# Patient Record
Sex: Male | Born: 1965 | Race: White | Hispanic: No | Marital: Married | Smoking: Never smoker
Health system: Southern US, Community
[De-identification: ages and names within clinical notes are randomized; demographics above are authoritative.]

## PROBLEM LIST (undated history)

## (undated) DIAGNOSIS — K649 Unspecified hemorrhoids: Secondary | ICD-10-CM

## (undated) DIAGNOSIS — R112 Nausea with vomiting, unspecified: Secondary | ICD-10-CM

## (undated) DIAGNOSIS — Z9889 Other specified postprocedural states: Secondary | ICD-10-CM

## (undated) DIAGNOSIS — E785 Hyperlipidemia, unspecified: Secondary | ICD-10-CM

## (undated) HISTORY — DX: Other specified postprocedural states: Z98.890

## (undated) HISTORY — DX: Unspecified hemorrhoids: K64.9

## (undated) HISTORY — DX: Nausea with vomiting, unspecified: R11.2

## (undated) HISTORY — PX: INGUINAL HERNIA REPAIR: SUR1180

## (undated) HISTORY — PX: WISDOM TOOTH EXTRACTION: SHX21

## (undated) HISTORY — DX: Hyperlipidemia, unspecified: E78.5

---

## 1998-07-26 ENCOUNTER — Ambulatory Visit (HOSPITAL_COMMUNITY): Admission: RE | Admit: 1998-07-26 | Discharge: 1998-07-26 | Payer: Self-pay | Admitting: Neurosurgery

## 1998-07-26 ENCOUNTER — Encounter: Payer: Self-pay | Admitting: Neurosurgery

## 2000-12-10 ENCOUNTER — Ambulatory Visit (HOSPITAL_COMMUNITY): Admission: RE | Admit: 2000-12-10 | Discharge: 2000-12-10 | Payer: Self-pay | Admitting: Internal Medicine

## 2006-12-24 ENCOUNTER — Encounter: Admission: RE | Admit: 2006-12-24 | Discharge: 2006-12-24 | Payer: Self-pay | Admitting: Internal Medicine

## 2007-09-22 ENCOUNTER — Encounter: Admission: RE | Admit: 2007-09-22 | Discharge: 2007-09-22 | Payer: Self-pay | Admitting: Internal Medicine

## 2008-02-20 IMAGING — US US ABDOMEN COMPLETE
1 series · 14 of 25 positions shown · non-contrast
Comparison: 12/24/2006

ABDOMEN ULTRASOUND:

CLINICAL DATA: History of gallbladder polyp. Elevated LFTs.
TECHNIQUE: Complete abdominal ultrasound examination was performed including
evaluation of the liver, gallbladder, bile ducts, pancreas, kidneys, spleen,
IVC, and abdominal aorta.

[Series 1: us abdomen complete · 0.20mm/px · 14 of 85 slices shown]
[im 1/85]
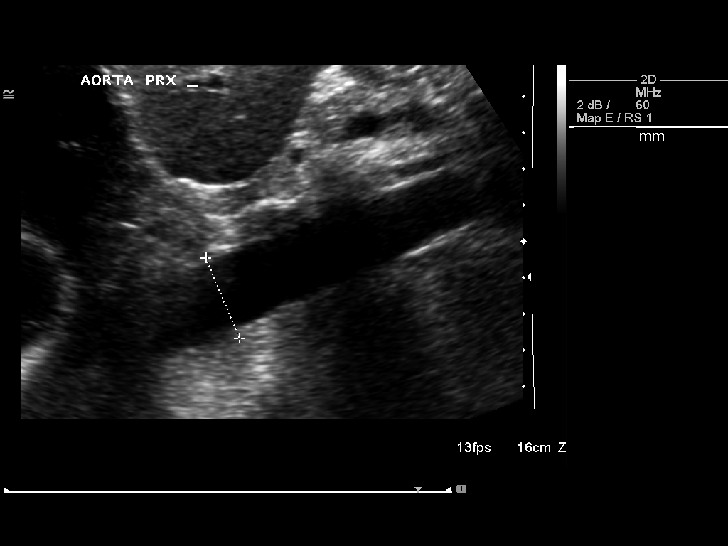
[im 8/85]
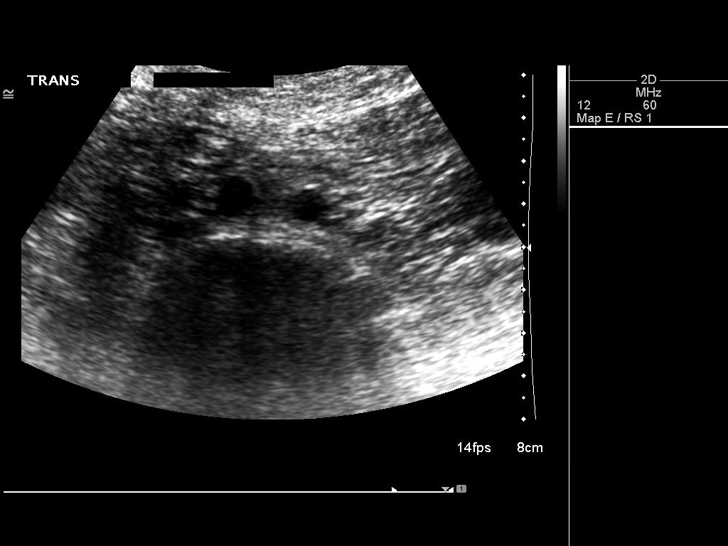
[im 15/85]
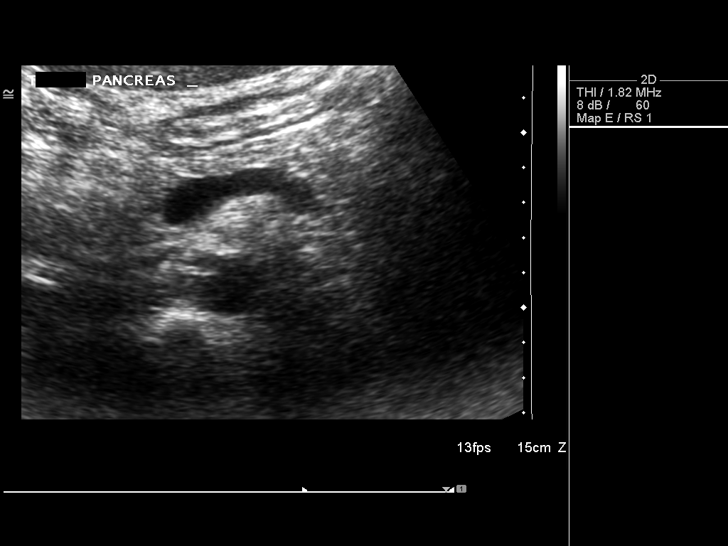
[im 22/85]
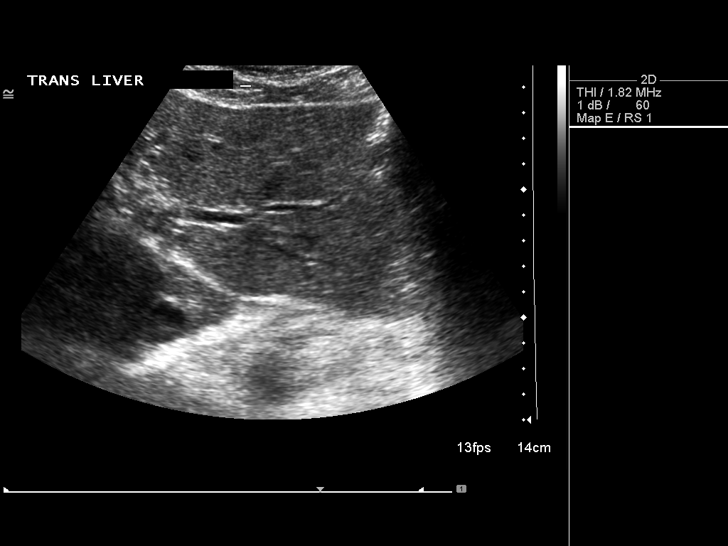
[im 29/85]
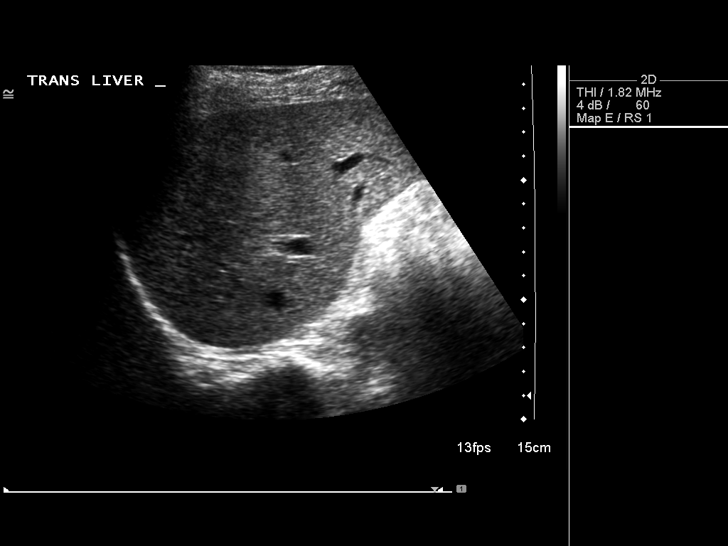
[im 32/85]
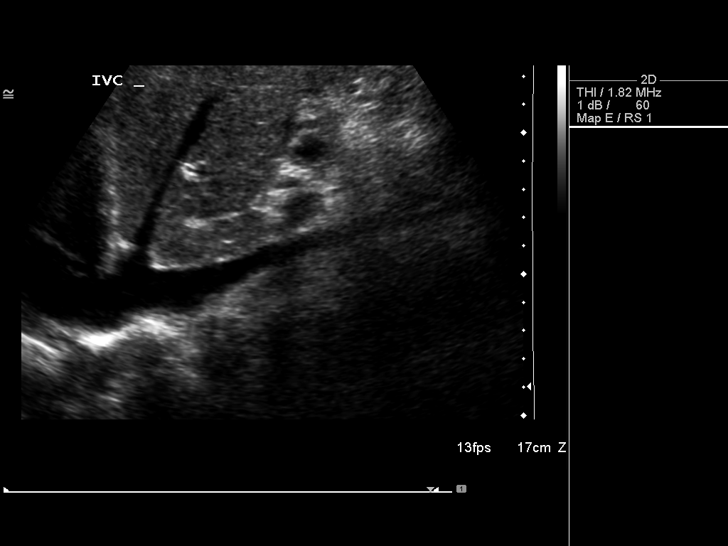
[im 39/85]
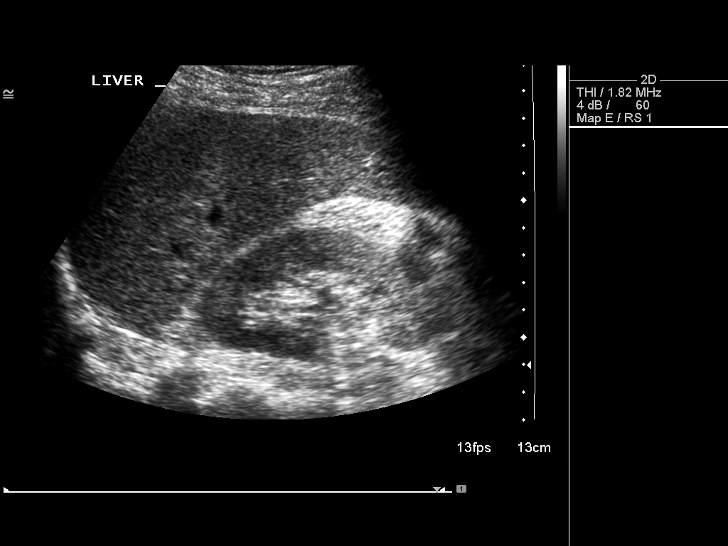
[im 46/85]
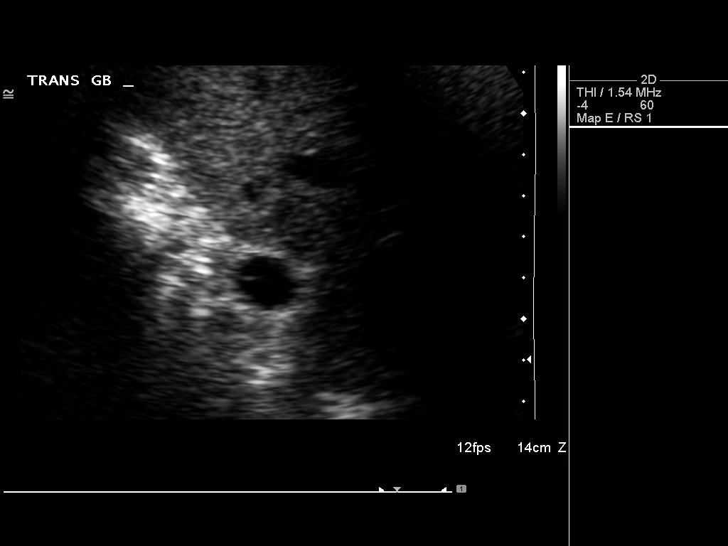
[im 53/85]
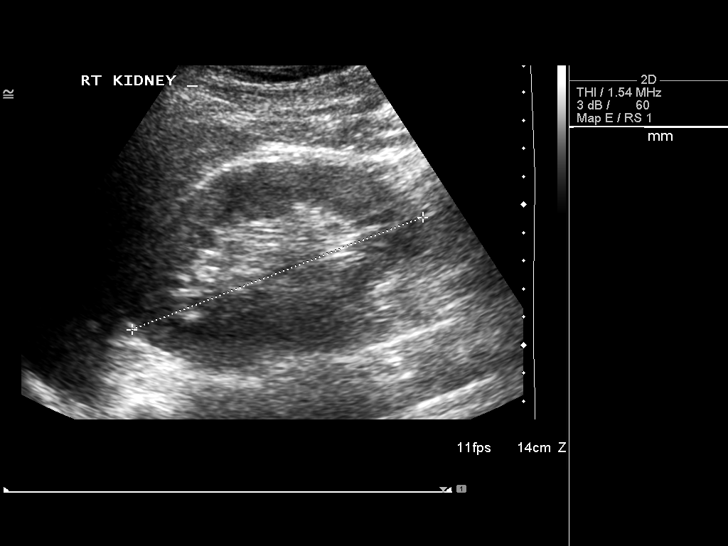
[im 57/85]
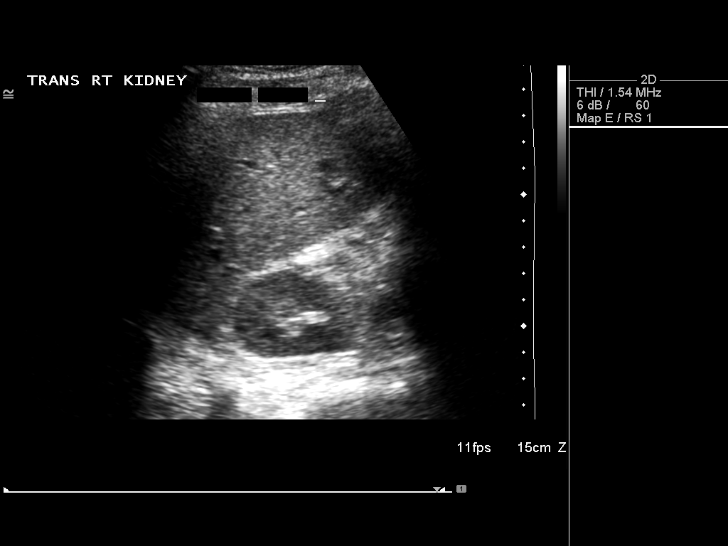
[im 64/85]
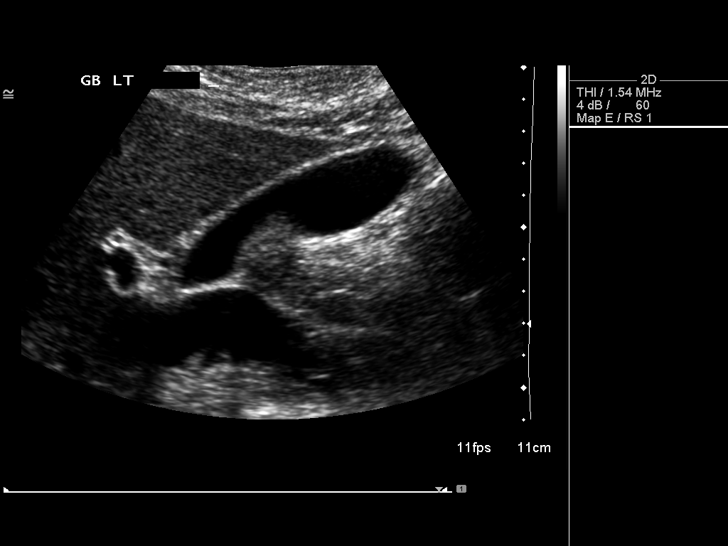
[im 71/85]
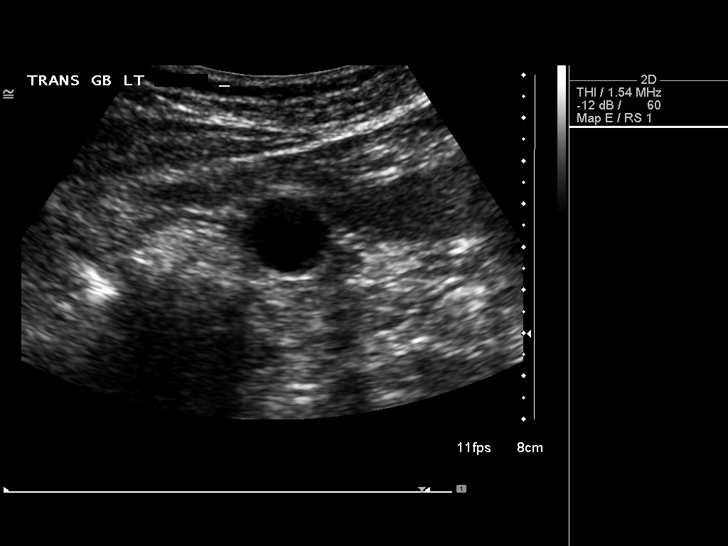
[im 78/85]
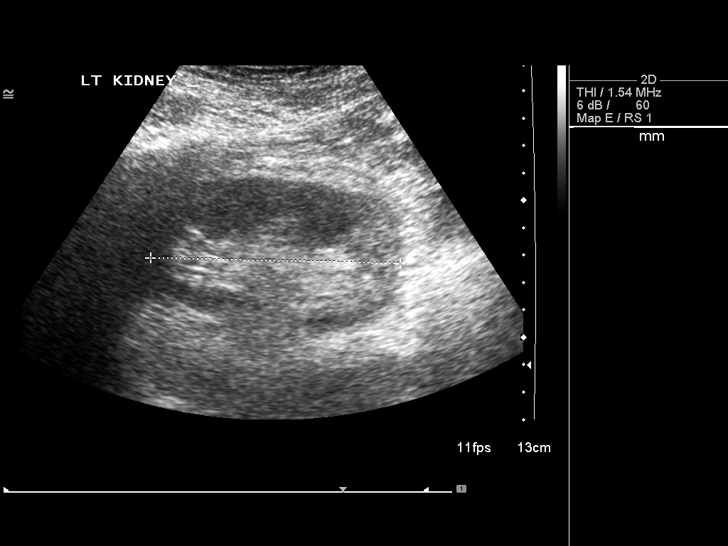
[im 85/85]
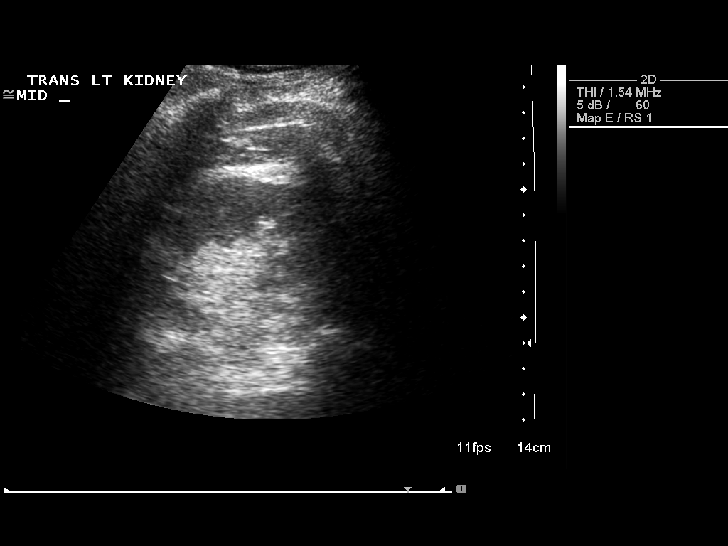

[14 of 25 positions shown; findings below may reference images not displayed]

FINDINGS: Gallbladder: No gallstones or gallbladder wall thickening. The tiny polyp
identified on the previous study has imaging features consistent with a mucosal
fold in the neck of the gallbladder on today's study. The appearance on the
previous study was more polypoid in nature, but on today's study, this clearly
represents a mucosal fold.

Common Bile Duct:  Nondilated

Liver:  Normal

Inferior Vena Cava:  Normal

Pancreas:  Normal

Spleen:  Normal

Right Kidney:  11.1 cm in long axis.  Normal

Left Kidney:  10.8 cm in long axis.  Normal

Aorta:  No aneurysm
IMPRESSION: Normal abdominal ultrasound.

## 2016-01-27 ENCOUNTER — Encounter: Payer: Self-pay | Admitting: Internal Medicine

## 2016-06-18 ENCOUNTER — Encounter: Payer: Self-pay | Admitting: Gastroenterology

## 2016-07-10 ENCOUNTER — Ambulatory Visit (AMBULATORY_SURGERY_CENTER): Payer: Self-pay

## 2016-07-10 VITALS — Ht 69.0 in | Wt 165.4 lb

## 2016-07-10 DIAGNOSIS — Z1211 Encounter for screening for malignant neoplasm of colon: Secondary | ICD-10-CM

## 2016-07-10 MED ORDER — NA SULFATE-K SULFATE-MG SULF 17.5-3.13-1.6 GM/177ML PO SOLN: ORAL | 0 refills | Status: DC

## 2016-07-10 NOTE — Progress Notes (Signed)
Pt states he had a colonoscopy done as a teenager, but it was normal!  Per pt, no allergies to soy or egg products.Pt not taking any weight loss meds or using  O2 at home.

## 2016-07-21 ENCOUNTER — Encounter: Payer: Self-pay | Admitting: Gastroenterology

## 2016-07-30 ENCOUNTER — Telehealth: Payer: Self-pay | Admitting: Gastroenterology

## 2016-08-02 NOTE — Telephone Encounter (Signed)
No charge, thanks 

## 2016-08-03 ENCOUNTER — Encounter: Payer: Self-pay | Admitting: Gastroenterology

## 2016-09-17 ENCOUNTER — Telehealth: Payer: Self-pay | Admitting: Gastroenterology

## 2016-09-17 DIAGNOSIS — Z1211 Encounter for screening for malignant neoplasm of colon: Secondary | ICD-10-CM

## 2016-09-17 MED ORDER — NA SULFATE-K SULFATE-MG SULF 17.5-3.13-1.6 GM/177ML PO SOLN: ORAL | 0 refills | Status: DC

## 2016-09-17 NOTE — Telephone Encounter (Signed)
Resent prep to pt's pharmacy and [facility] to call back if he has any further questions

## 2016-09-21 ENCOUNTER — Telehealth: Payer: Self-pay | Admitting: Gastroenterology

## 2016-09-21 ENCOUNTER — Encounter: Payer: 59 | Admitting: Gastroenterology

## 2016-09-21 NOTE — Telephone Encounter (Signed)
LMOM for pt to come and pick up instructions and to call if he has any questions

## 2016-09-21 NOTE — Telephone Encounter (Signed)
No charge. 

## 2016-09-24 ENCOUNTER — Telehealth: Payer: Self-pay | Admitting: *Deleted

## 2016-09-24 ENCOUNTER — Encounter: Payer: Self-pay | Admitting: Internal Medicine

## 2016-09-24 NOTE — Telephone Encounter (Signed)
Pt didn't come in to pick up instructions.  I called him to review instructions- he did eat eggs and a bagel this a.m. So I told him to push his fluids the rest of today.  Reviewed all instructions and pt voiced understanding.

## 2016-09-25 ENCOUNTER — Ambulatory Visit (AMBULATORY_SURGERY_CENTER): Payer: 59 | Admitting: Gastroenterology

## 2016-09-25 ENCOUNTER — Encounter: Payer: Self-pay | Admitting: Gastroenterology

## 2016-09-25 VITALS — BP 116/66 | HR 65 | Temp 98.7°F | Resp 15 | Ht 69.0 in | Wt 165.0 lb

## 2016-09-25 DIAGNOSIS — Z1212 Encounter for screening for malignant neoplasm of rectum: Secondary | ICD-10-CM

## 2016-09-25 DIAGNOSIS — Z1211 Encounter for screening for malignant neoplasm of colon: Secondary | ICD-10-CM

## 2016-09-25 DIAGNOSIS — D120 Benign neoplasm of cecum: Secondary | ICD-10-CM

## 2016-09-25 MED ORDER — SODIUM CHLORIDE 0.9 % IV SOLN: 500.0000 mL | INTRAVENOUS | Status: AC

## 2016-09-25 NOTE — Progress Notes (Signed)
A/ox3 pleased with MAC, report to Penny RN 

## 2016-09-25 NOTE — Progress Notes (Signed)
Called to room to assist during endoscopic procedure.  Patient ID and intended procedure confirmed with present staff. Received instructions for my participation in the procedure from the performing physician.  

## 2016-09-25 NOTE — Op Note (Signed)
[facility] Patient Name: Malik Johnson Procedure Date: 09/25/2016 8:05 AM MRN: [id number] Endoscopist: Milus Banister , MD Age: 50 Referring MD:  Date of Birth: 1966/07/18 Gender: Male Account #: [id number] Procedure:                Colonoscopy Indications:              Screening for colorectal malignant neoplasm Medicines:                Monitored Anesthesia Care Procedure:                Pre-Anesthesia Assessment:                           - Prior to the procedure, a History and Physical                            was performed, and patient medications and                            allergies were reviewed. The patient's tolerance of                            previous anesthesia was also reviewed. The risks                            and benefits of the procedure and the sedation                            options and risks were discussed with the patient.                            All questions were answered, and informed consent                            was obtained. Prior Anticoagulants: The patient has                            taken no previous anticoagulant or antiplatelet                            agents. ASA Grade Assessment: II - A patient with                            mild systemic disease. After reviewing the risks                            and benefits, the patient was deemed in                            satisfactory condition to undergo the procedure.                           After obtaining informed consent, the colonoscope  was passed under direct vision. Throughout the                            procedure, the patient's blood pressure, pulse, and                            oxygen saturations were monitored continuously. The                            Model CF-HQ190L [id number]) scope was introduced                            through the anus and advanced to the the cecum,                            identified by  appendiceal orifice and ileocecal                            valve. The colonoscopy was performed without                            difficulty. The patient tolerated the procedure                            well. The quality of the bowel preparation was                            good. The ileocecal valve, appendiceal orifice, and                            rectum were photographed. Scope In: 8:13:20 AM Scope Out: 8:24:11 AM Scope Withdrawal Time: 0 hours 8 minutes 37 seconds  Total Procedure Duration: 0 hours 10 minutes 51 seconds  Findings:                 A 5 mm polyp was found in the cecum. The polyp was                            sessile. The polyp was removed with a cold snare.                            Resection and retrieval were complete.                           The exam was otherwise without abnormality on                            direct and retroflexion views. Complications:            No immediate complications. Estimated blood loss:                            None. Estimated Blood Loss:     Estimated blood loss: none. Impression:               -  One 5 mm polyp in the cecum, removed with a cold                            snare. Resected and retrieved.                           - The examination was otherwise normal on direct                            and retroflexion views. Recommendation:           - Patient has a contact number available for                            emergencies. The signs and symptoms of potential                            delayed complications were discussed with the                            patient. Return to normal activities tomorrow.                            Written discharge instructions were provided to the                            patient.                           - Resume previous diet.                           - Continue present medications.                           You will receive a letter within 2-3 weeks with the                             pathology results and my final recommendations.                           If the polyp(s) is proven to be 'pre-cancerous' on                            pathology, you will need repeat colonoscopy in 5                            years. If the polyp(s) is NOT 'precancerous' on                            pathology then you should repeat colon cancer                            screening in 10 years with colonoscopy without need  for colon cancer screening by any method prior to                            then (including stool testing). Milus Banister, MD 09/25/2016 8:28:36 AM This report has been signed electronically.

## 2016-09-25 NOTE — Patient Instructions (Addendum)
YOU HAD AN ENDOSCOPIC PROCEDURE TODAY AT THE [facility]:   Refer to the procedure report that was given to you for any specific questions about what was found during the examination.  If the procedure report does not answer your questions, please call your gastroenterologist to clarify.  If you requested that your care partner not be given the details of your procedure findings, then the procedure report has been included in a sealed envelope for you to review at your convenience later.  YOU SHOULD EXPECT: Some feelings of bloating in the abdomen. Passage of more gas than usual.  Walking can help get rid of the air that was put into your GI tract during the procedure and reduce the bloating. If you had a lower endoscopy (such as a colonoscopy or flexible sigmoidoscopy) you may notice spotting of blood in your stool or on the toilet paper. If you underwent a bowel prep for your procedure, you may not have a normal bowel movement for a few days.  Please Note:  You might notice some irritation and congestion in your nose or some drainage.  This is from the oxygen used during your procedure.  There is no need for concern and it should clear up in a day or so.  SYMPTOMS TO REPORT IMMEDIATELY:   Following lower endoscopy (colonoscopy or flexible sigmoidoscopy):  Excessive amounts of blood in the stool  Significant tenderness or worsening of abdominal pains  Swelling of the abdomen that is new, acute  Fever of 100F or higher    For urgent or emergent issues, a gastroenterologist can be reached at any hour by calling [phone].   DIET:  We do recommend a small meal at first, but then you may proceed to your regular diet.  Drink plenty of fluids but you should avoid alcoholic beverages for 24 hours.  ACTIVITY:  You should plan to take it easy for the rest of today and you should NOT DRIVE or use heavy machinery until tomorrow (because of the sedation medicines used during the test).     FOLLOW UP: Our staff will call the number listed on your records the next business day following your procedure to check on you and address any questions or concerns that you may have regarding the information given to you following your procedure. If we do not reach you, we will leave a message.  However, if you are feeling well and you are not experiencing any problems, there is no need to return our call.  We will assume that you have returned to your regular daily activities without incident.  If any biopsies were taken you will be contacted by phone or by letter within the next 1-3 weeks.  Please call us at [phone] if you have not heard about the biopsies in 3 weeks.    SIGNATURES/CONFIDENTIALITY: You and/or your care partner have signed paperwork which will be entered into your electronic medical record.  These signatures attest to the fact that that the information above on your After Visit Summary has been reviewed and is understood.  Full responsibility of the confidentiality of this discharge information lies with you and/or your care-partner.    Information on polyps given to you today  Await pathology report    

## 2016-09-28 ENCOUNTER — Telehealth: Payer: Self-pay | Admitting: *Deleted

## 2016-09-28 ENCOUNTER — Telehealth: Payer: Self-pay

## 2016-09-28 NOTE — Telephone Encounter (Signed)
Message left

## 2016-09-28 NOTE — Telephone Encounter (Signed)
  Follow up Call-  Call back number 09/25/2016  Post procedure Call Back phone  # [phone]  Permission to leave phone message Yes  Some recent data might be hidden    Patient was called for follow up after his procedure on 09/25/2016. No answer at the number given for follow up phone call. A message was left on the answering machine.

## 2016-10-01 ENCOUNTER — Encounter: Payer: Self-pay | Admitting: Gastroenterology

## 2016-10-14 ENCOUNTER — Telehealth: Payer: Self-pay | Admitting: Gastroenterology

## 2016-10-14 NOTE — Telephone Encounter (Signed)
Patient found his letter and now has results.

## 2016-10-14 NOTE — Telephone Encounter (Signed)
Left message for pt to call back  °

## 2016-12-24 DIAGNOSIS — Z0000 Encounter for general adult medical examination without abnormal findings: Secondary | ICD-10-CM | POA: Diagnosis not present

## 2016-12-29 DIAGNOSIS — Z1389 Encounter for screening for other disorder: Secondary | ICD-10-CM | POA: Diagnosis not present

## 2016-12-29 DIAGNOSIS — E784 Other hyperlipidemia: Secondary | ICD-10-CM | POA: Diagnosis not present

## 2016-12-29 DIAGNOSIS — N320 Bladder-neck obstruction: Secondary | ICD-10-CM | POA: Diagnosis not present

## 2016-12-29 DIAGNOSIS — Z0000 Encounter for general adult medical examination without abnormal findings: Secondary | ICD-10-CM | POA: Diagnosis not present

## 2017-01-01 DIAGNOSIS — Z1212 Encounter for screening for malignant neoplasm of rectum: Secondary | ICD-10-CM | POA: Diagnosis not present

## 2017-08-23 DIAGNOSIS — K649 Unspecified hemorrhoids: Secondary | ICD-10-CM | POA: Diagnosis not present

## 2017-08-23 DIAGNOSIS — N411 Chronic prostatitis: Secondary | ICD-10-CM | POA: Diagnosis not present

## 2017-09-16 DIAGNOSIS — R05 Cough: Secondary | ICD-10-CM | POA: Diagnosis not present

## 2017-09-16 DIAGNOSIS — J069 Acute upper respiratory infection, unspecified: Secondary | ICD-10-CM | POA: Diagnosis not present

## 2017-12-09 DIAGNOSIS — R102 Pelvic and perineal pain: Secondary | ICD-10-CM | POA: Diagnosis not present

## 2017-12-16 DIAGNOSIS — M545 Low back pain: Secondary | ICD-10-CM | POA: Diagnosis not present

## 2017-12-21 DIAGNOSIS — M545 Low back pain: Secondary | ICD-10-CM | POA: Diagnosis not present

## 2017-12-22 DIAGNOSIS — Z0000 Encounter for general adult medical examination without abnormal findings: Secondary | ICD-10-CM | POA: Diagnosis not present

## 2018-01-04 DIAGNOSIS — Z0000 Encounter for general adult medical examination without abnormal findings: Secondary | ICD-10-CM | POA: Diagnosis not present

## 2018-01-04 DIAGNOSIS — Z1389 Encounter for screening for other disorder: Secondary | ICD-10-CM | POA: Diagnosis not present

## 2018-01-04 DIAGNOSIS — E7849 Other hyperlipidemia: Secondary | ICD-10-CM | POA: Diagnosis not present

## 2018-01-04 DIAGNOSIS — M545 Low back pain: Secondary | ICD-10-CM | POA: Diagnosis not present

## 2018-01-07 DIAGNOSIS — Z1212 Encounter for screening for malignant neoplasm of rectum: Secondary | ICD-10-CM | POA: Diagnosis not present

## 2018-11-20 DIAGNOSIS — M25521 Pain in right elbow: Secondary | ICD-10-CM | POA: Diagnosis not present

## 2018-11-24 DIAGNOSIS — M25521 Pain in right elbow: Secondary | ICD-10-CM | POA: Diagnosis not present

## 2018-12-19 DIAGNOSIS — M25521 Pain in right elbow: Secondary | ICD-10-CM | POA: Diagnosis not present

## 2019-01-05 DIAGNOSIS — Z0000 Encounter for general adult medical examination without abnormal findings: Secondary | ICD-10-CM | POA: Diagnosis not present

## 2019-01-05 DIAGNOSIS — Z125 Encounter for screening for malignant neoplasm of prostate: Secondary | ICD-10-CM | POA: Diagnosis not present

## 2019-01-05 DIAGNOSIS — E7849 Other hyperlipidemia: Secondary | ICD-10-CM | POA: Diagnosis not present

## 2019-01-12 DIAGNOSIS — E785 Hyperlipidemia, unspecified: Secondary | ICD-10-CM | POA: Diagnosis not present

## 2019-01-12 DIAGNOSIS — N529 Male erectile dysfunction, unspecified: Secondary | ICD-10-CM | POA: Diagnosis not present

## 2019-01-12 DIAGNOSIS — M545 Low back pain: Secondary | ICD-10-CM | POA: Diagnosis not present

## 2019-01-12 DIAGNOSIS — Z0000 Encounter for general adult medical examination without abnormal findings: Secondary | ICD-10-CM | POA: Diagnosis not present

## 2019-01-12 DIAGNOSIS — Z1331 Encounter for screening for depression: Secondary | ICD-10-CM | POA: Diagnosis not present

## 2020-01-11 DIAGNOSIS — E7849 Other hyperlipidemia: Secondary | ICD-10-CM | POA: Diagnosis not present

## 2020-01-11 DIAGNOSIS — Z0000 Encounter for general adult medical examination without abnormal findings: Secondary | ICD-10-CM | POA: Diagnosis not present

## 2020-01-11 DIAGNOSIS — Z125 Encounter for screening for malignant neoplasm of prostate: Secondary | ICD-10-CM | POA: Diagnosis not present

## 2020-01-18 DIAGNOSIS — E785 Hyperlipidemia, unspecified: Secondary | ICD-10-CM | POA: Diagnosis not present

## 2020-01-18 DIAGNOSIS — R030 Elevated blood-pressure reading, without diagnosis of hypertension: Secondary | ICD-10-CM | POA: Diagnosis not present

## 2020-01-18 DIAGNOSIS — M545 Low back pain: Secondary | ICD-10-CM | POA: Diagnosis not present

## 2020-01-18 DIAGNOSIS — R82998 Other abnormal findings in urine: Secondary | ICD-10-CM | POA: Diagnosis not present

## 2020-01-18 DIAGNOSIS — Z1331 Encounter for screening for depression: Secondary | ICD-10-CM | POA: Diagnosis not present

## 2020-01-18 DIAGNOSIS — N529 Male erectile dysfunction, unspecified: Secondary | ICD-10-CM | POA: Diagnosis not present

## 2020-01-18 DIAGNOSIS — Z0000 Encounter for general adult medical examination without abnormal findings: Secondary | ICD-10-CM | POA: Diagnosis not present

## 2020-04-02 DIAGNOSIS — R030 Elevated blood-pressure reading, without diagnosis of hypertension: Secondary | ICD-10-CM | POA: Diagnosis not present

## 2020-04-04 DIAGNOSIS — R030 Elevated blood-pressure reading, without diagnosis of hypertension: Secondary | ICD-10-CM | POA: Diagnosis not present

## 2020-10-11 DIAGNOSIS — G44209 Tension-type headache, unspecified, not intractable: Secondary | ICD-10-CM | POA: Diagnosis not present

## 2020-10-14 DIAGNOSIS — I10 Essential (primary) hypertension: Secondary | ICD-10-CM | POA: Diagnosis not present

## 2020-11-04 DIAGNOSIS — I10 Essential (primary) hypertension: Secondary | ICD-10-CM | POA: Diagnosis not present

## 2021-01-20 DIAGNOSIS — E785 Hyperlipidemia, unspecified: Secondary | ICD-10-CM | POA: Diagnosis not present

## 2021-01-20 DIAGNOSIS — Z125 Encounter for screening for malignant neoplasm of prostate: Secondary | ICD-10-CM | POA: Diagnosis not present

## 2021-01-20 DIAGNOSIS — I10 Essential (primary) hypertension: Secondary | ICD-10-CM | POA: Diagnosis not present

## 2021-02-04 DIAGNOSIS — R82998 Other abnormal findings in urine: Secondary | ICD-10-CM | POA: Diagnosis not present

## 2021-02-04 DIAGNOSIS — Z0000 Encounter for general adult medical examination without abnormal findings: Secondary | ICD-10-CM | POA: Diagnosis not present

## 2021-02-04 DIAGNOSIS — I10 Essential (primary) hypertension: Secondary | ICD-10-CM | POA: Diagnosis not present

## 2021-05-08 DIAGNOSIS — I10 Essential (primary) hypertension: Secondary | ICD-10-CM | POA: Diagnosis not present

## 2021-05-13 DIAGNOSIS — D2262 Melanocytic nevi of left upper limb, including shoulder: Secondary | ICD-10-CM | POA: Diagnosis not present

## 2021-05-13 DIAGNOSIS — D224 Melanocytic nevi of scalp and neck: Secondary | ICD-10-CM | POA: Diagnosis not present

## 2021-05-13 DIAGNOSIS — D2261 Melanocytic nevi of right upper limb, including shoulder: Secondary | ICD-10-CM | POA: Diagnosis not present

## 2021-05-13 DIAGNOSIS — L821 Other seborrheic keratosis: Secondary | ICD-10-CM | POA: Diagnosis not present

## 2021-06-25 DIAGNOSIS — R059 Cough, unspecified: Secondary | ICD-10-CM | POA: Diagnosis not present

## 2021-06-25 DIAGNOSIS — U071 COVID-19: Secondary | ICD-10-CM | POA: Diagnosis not present

## 2021-06-26 ENCOUNTER — Other Ambulatory Visit: Payer: Self-pay | Admitting: Internal Medicine

## 2021-06-26 DIAGNOSIS — R911 Solitary pulmonary nodule: Secondary | ICD-10-CM

## 2021-07-21 ENCOUNTER — Ambulatory Visit
Admission: RE | Admit: 2021-07-21 | Discharge: 2021-07-21 | Disposition: A | Discharge status: Home / Self Care | Payer: BC Managed Care – PPO | Source: Ambulatory Visit | Attending: Internal Medicine | Admitting: Internal Medicine

## 2021-07-21 ENCOUNTER — Other Ambulatory Visit: Payer: Self-pay

## 2021-07-21 DIAGNOSIS — R911 Solitary pulmonary nodule: Secondary | ICD-10-CM | POA: Diagnosis not present

## 2021-07-21 DIAGNOSIS — I2510 Atherosclerotic heart disease of native coronary artery without angina pectoris: Secondary | ICD-10-CM | POA: Diagnosis not present

## 2021-07-21 MED ORDER — IOPAMIDOL (ISOVUE-300) INJECTION 61%
75.0000 mL | Freq: Once | INTRAVENOUS | Status: AC | PRN
  Administered 2021-07-21: 75 mL via INTRAVENOUS

## 2021-08-31 ENCOUNTER — Encounter: Payer: Self-pay | Admitting: Gastroenterology

## 2021-09-12 ENCOUNTER — Encounter: Payer: Self-pay | Admitting: Gastroenterology

## 2021-09-22 ENCOUNTER — Encounter: Payer: Self-pay | Admitting: Gastroenterology

## 2021-09-22 ENCOUNTER — Ambulatory Visit (AMBULATORY_SURGERY_CENTER): Payer: BC Managed Care – PPO

## 2021-09-22 ENCOUNTER — Other Ambulatory Visit: Payer: Self-pay

## 2021-09-22 VITALS — Ht 68.0 in | Wt 165.0 lb

## 2021-09-22 DIAGNOSIS — Z86010 Personal history of colonic polyps: Secondary | ICD-10-CM

## 2021-09-22 MED ORDER — PLENVU 140 G PO SOLR: 1.0000 | ORAL | 0 refills | Status: DC

## 2021-09-22 NOTE — Progress Notes (Signed)
   Patient's pre-visit was done today over the phone with the patient   Name,DOB and address verified.   Patient denies any allergies to Eggs and Soy.  Patient denies any problems with anesthesia/sedation. Patient denies taking diet pills or blood thinners.  Denies atrial flutter or atrial fib Denies chronic constipation No home Oxygen.   Packet of Prep instructions mailed to patient including a copy of a consent form-pt is aware.  Patient understands to call us back with any questions or concerns.  Patient is aware of our care-partner policy and [id number] safety protocol.   EMMI education assigned to the patient for the procedure, sent to [facility].

## 2021-10-03 ENCOUNTER — Ambulatory Visit (AMBULATORY_SURGERY_CENTER): Payer: BC Managed Care – PPO | Admitting: Gastroenterology

## 2021-10-03 ENCOUNTER — Encounter: Payer: Self-pay | Admitting: Gastroenterology

## 2021-10-03 VITALS — BP 107/64 | HR 68 | Temp 98.8°F | Resp 10 | Ht 68.0 in | Wt 165.0 lb

## 2021-10-03 DIAGNOSIS — K635 Polyp of colon: Secondary | ICD-10-CM

## 2021-10-03 DIAGNOSIS — D122 Benign neoplasm of ascending colon: Secondary | ICD-10-CM

## 2021-10-03 DIAGNOSIS — Z86010 Personal history of colonic polyps: Secondary | ICD-10-CM

## 2021-10-03 DIAGNOSIS — Z1211 Encounter for screening for malignant neoplasm of colon: Secondary | ICD-10-CM | POA: Diagnosis not present

## 2021-10-03 MED ORDER — SODIUM CHLORIDE 0.9 % IV SOLN: 500.0000 mL | Freq: Once | INTRAVENOUS | Status: DC

## 2021-10-03 NOTE — Progress Notes (Signed)
VS-CW  Pt's states no medical or surgical changes since previsit or office visit.  

## 2021-10-03 NOTE — Progress Notes (Signed)
Called to room to assist during endoscopic procedure.  Patient ID and intended procedure confirmed with present staff. Received instructions for my participation in the procedure from the performing physician.  

## 2021-10-03 NOTE — Progress Notes (Signed)
Report given to PACU, vss 

## 2021-10-03 NOTE — Progress Notes (Signed)
HPI: This is a man with h/o polyps  Colonoscopy 2017 single subCM SSA  ROS: complete GI ROS as described in HPI, all other review negative.  Constitutional:  No unintentional weight loss   Past Medical History:  Diagnosis Date   Hemorrhoids    Hyperlipidemia    Post-operative nausea and vomiting     Past Surgical History:  Procedure Laterality Date   INGUINAL HERNIA REPAIR     right side   WISDOM TOOTH EXTRACTION      Current Outpatient Medications  Medication Sig Dispense Refill   rosuvastatin (CRESTOR) 20 MG tablet 1 tablet     sildenafil (REVATIO) 20 MG tablet take 2 to 5 tablets     pravastatin (PRAVACHOL) 40 MG tablet Take 40 mg by mouth. Take one pill 3 times a week (Patient not taking: Reported on 09/22/2021)     Current Facility-Administered Medications  Medication Dose Route Frequency Provider Last Rate Last Admin   0.9 %  sodium chloride infusion  500 mL Intravenous Continuous Milus Banister, MD       0.9 %  sodium chloride infusion  500 mL Intravenous Continuous Milus Banister, MD       0.9 %  sodium chloride infusion  500 mL Intravenous Once Milus Banister, MD        Allergies as of 10/03/2021   (No Known Allergies)    Family History  Problem Relation Age of Onset   Colon polyps Father    Prostate cancer Father    Breast cancer Maternal Grandmother    Heart disease Maternal Grandfather    Colon cancer Neg Hx    Stomach cancer Neg Hx    Rectal cancer Neg Hx    Pancreatic cancer Neg Hx    Esophageal cancer Neg Hx     Social History   Socioeconomic History   Marital status: Married    Spouse name: Not on file   Number of children: Not on file   Years of education: Not on file   Highest education level: Not on file  Occupational History   Not on file  Tobacco Use   Smoking status: Never   Smokeless tobacco: Never  Vaping Use   Vaping Use: Never used  Substance and Sexual Activity   Alcohol use: No   Drug use: No   Sexual activity:  Not on file  Other Topics Concern   Not on file  Social History Narrative   Not on file   Social Determinants of Health   Financial Resource Strain: Not on file  Food Insecurity: Not on file  Transportation Needs: Not on file  Physical Activity: Not on file  Stress: Not on file  Social Connections: Not on file  Intimate Partner Violence: Not on file     Physical Exam: BP (!) 130/98    Pulse 99    Temp 98.8 F (37.1 C)    Ht 5\' 8"  (1.727 m)    Wt 165 lb (74.8 kg)    SpO2 97%    BMI 25.09 kg/m  Constitutional: generally well-appearing Psychiatric: alert and oriented x3 Lungs: CTA bilaterally Heart: no MCR  Assessment and plan: 55 y.o. male with h/o polyps  Colonoscopy today  Care is appropriate for the ambulatory setting.  Owens Loffler, MD [facility] 10/03/2021, 10:47 AM

## 2021-10-03 NOTE — Patient Instructions (Signed)
Information on polys given to you today.  Await pathology results.  Resume previous diet and medications.  Repeat colonoscopy in 10 years for screening purposes.   YOU HAD AN ENDOSCOPIC PROCEDURE TODAY AT [facility]:   Refer to the procedure report that was given to you for any specific questions about what was found during the examination.  If the procedure report does not answer your questions, please call your gastroenterologist to clarify.  If you requested that your care partner not be given the details of your procedure findings, then the procedure report has been included in a sealed envelope for you to review at your convenience later.  YOU SHOULD EXPECT: Some feelings of bloating in the abdomen. Passage of more gas than usual.  Walking can help get rid of the air that was put into your GI tract during the procedure and reduce the bloating. If you had a lower endoscopy (such as a colonoscopy or flexible sigmoidoscopy) you may notice spotting of blood in your stool or on the toilet paper. If you underwent a bowel prep for your procedure, you may not have a normal bowel movement for a few days.  Please Note:  You might notice some irritation and congestion in your nose or some drainage.  This is from the oxygen used during your procedure.  There is no need for concern and it should clear up in a day or so.  SYMPTOMS TO REPORT IMMEDIATELY:  Following lower endoscopy (colonoscopy or flexible sigmoidoscopy):  Excessive amounts of blood in the stool  Significant tenderness or worsening of abdominal pains  Swelling of the abdomen that is new, acute  Fever of 100F or higher   For urgent or emergent issues, a gastroenterologist can be reached at any hour by calling [phone]. Do not use MyChart messaging for urgent concerns.    DIET:  We do recommend a small meal at first, but then you may proceed to your regular diet.  Drink plenty of fluids but you should avoid  alcoholic beverages for 24 hours.  ACTIVITY:  You should plan to take it easy for the rest of today and you should NOT DRIVE or use heavy machinery until tomorrow (because of the sedation medicines used during the test).    FOLLOW UP: Our staff will call the number listed on your records 48-72 hours following your procedure to check on you and address any questions or concerns that you may have regarding the information given to you following your procedure. If we do not reach you, we will leave a message.  We will attempt to reach you two times.  During this call, we will ask if you have developed any symptoms of COVID 19. If you develop any symptoms (ie: fever, flu-like symptoms, shortness of breath, cough etc.) before then, please call [phone].  If you test positive for Covid 19 in the 2 weeks post procedure, please call and report this information to [place].    If any biopsies were taken you will be contacted by phone or by letter within the next 1-3 weeks.  Please call us at [phone] if you have not heard about the biopsies in 3 weeks.    SIGNATURES/CONFIDENTIALITY: You and/or your care partner have signed paperwork which will be entered into your electronic medical record.  These signatures attest to the fact that that the information above on your After Visit Summary has been reviewed and is understood.  Full responsibility of the confidentiality of  this discharge information lies with you and/or your care-partner.

## 2021-10-03 NOTE — Op Note (Signed)
[facility] Patient Name: Zuriel Roskos Procedure Date: 10/03/2021 10:51 AM MRN: [id number] Endoscopist: Milus Banister , MD Age: 55 Referring MD:  Date of Birth: 06/19/1966 Gender: Male Account #: [id number] Procedure:                Colonoscopy Indications:              High risk colon cancer surveillance: Personal                            history of colonic polyps; Colonoscopy 2017 single                            subCM SSA Medicines:                Monitored Anesthesia Care Procedure:                Pre-Anesthesia Assessment:                           - Prior to the procedure, a History and Physical                            was performed, and patient medications and                            allergies were reviewed. The patient's tolerance of                            previous anesthesia was also reviewed. The risks                            and benefits of the procedure and the sedation                            options and risks were discussed with the patient.                            All questions were answered, and informed consent                            was obtained. Prior Anticoagulants: The patient has                            taken no previous anticoagulant or antiplatelet                            agents. ASA Grade Assessment: II - A patient with                            mild systemic disease. After reviewing the risks                            and benefits, the patient was deemed in  satisfactory condition to undergo the procedure.                           After obtaining informed consent, the colonoscope                            was passed under direct vision. Throughout the                            procedure, the patient's blood pressure, pulse, and                            oxygen saturations were monitored continuously. The                            Olympus CF-HQ190L ([id number]) Colonoscope was                             introduced through the anus and advanced to the the                            cecum, identified by appendiceal orifice and                            ileocecal valve. The colonoscopy was performed                            without difficulty. The patient tolerated the                            procedure well. The quality of the bowel                            preparation was good. The ileocecal valve,                            appendiceal orifice, and rectum were photographed. Scope In: 10:57:25 AM Scope Out: 11:10:29 AM Scope Withdrawal Time: 0 hours 10 minutes 21 seconds  Total Procedure Duration: 0 hours 13 minutes 4 seconds  Findings:                 A 2 mm polyp was found in the ascending colon. The                            polyp was sessile. The polyp was removed with a                            cold snare. Resection was complete, but the polyp                            tissue was not retrieved.                           The exam was otherwise without abnormality on  direct and retroflexion views. Complications:            No immediate complications. Estimated blood loss:                            None. Estimated Blood Loss:     Estimated blood loss: none. Impression:               - One 2 mm polyp in the ascending colon, removed                            with a cold snare. Complete resection. Polyp tissue                            not retrieved.                           - The examination was otherwise normal on direct                            and retroflexion views. Recommendation:           - Patient has a contact number available for                            emergencies. The signs and symptoms of potential                            delayed complications were discussed with the                            patient. Return to normal activities tomorrow.                            Written discharge instructions were  provided to the                            patient.                           - Resume previous diet.                           - Continue present medications.                           - Repeat colonoscopy in 10 years for screening. Milus Banister, MD 10/03/2021 11:14:33 AM This report has been signed electronically.

## 2021-10-08 ENCOUNTER — Telehealth: Payer: Self-pay | Admitting: *Deleted

## 2021-10-08 NOTE — Telephone Encounter (Signed)
Left message on f/u call 

## 2021-10-27 ENCOUNTER — Encounter: Payer: BC Managed Care – PPO | Admitting: Gastroenterology

## 2021-11-14 ENCOUNTER — Encounter: Payer: BC Managed Care – PPO | Admitting: Gastroenterology

## 2021-12-10 DIAGNOSIS — J069 Acute upper respiratory infection, unspecified: Secondary | ICD-10-CM | POA: Diagnosis not present

## 2021-12-24 DIAGNOSIS — J4520 Mild intermittent asthma, uncomplicated: Secondary | ICD-10-CM | POA: Diagnosis not present

## 2021-12-24 DIAGNOSIS — Z1152 Encounter for screening for COVID-19: Secondary | ICD-10-CM | POA: Diagnosis not present

## 2021-12-24 DIAGNOSIS — R051 Acute cough: Secondary | ICD-10-CM | POA: Diagnosis not present

## 2022-02-10 DIAGNOSIS — Z125 Encounter for screening for malignant neoplasm of prostate: Secondary | ICD-10-CM | POA: Diagnosis not present

## 2022-02-10 DIAGNOSIS — I10 Essential (primary) hypertension: Secondary | ICD-10-CM | POA: Diagnosis not present

## 2022-02-17 DIAGNOSIS — Z1331 Encounter for screening for depression: Secondary | ICD-10-CM | POA: Diagnosis not present

## 2022-02-17 DIAGNOSIS — Z0000 Encounter for general adult medical examination without abnormal findings: Secondary | ICD-10-CM | POA: Diagnosis not present

## 2022-02-17 DIAGNOSIS — I10 Essential (primary) hypertension: Secondary | ICD-10-CM | POA: Diagnosis not present

## 2022-02-17 DIAGNOSIS — R82998 Other abnormal findings in urine: Secondary | ICD-10-CM | POA: Diagnosis not present

## 2022-03-27 ENCOUNTER — Other Ambulatory Visit: Payer: Self-pay | Admitting: Internal Medicine

## 2022-03-27 DIAGNOSIS — R911 Solitary pulmonary nodule: Secondary | ICD-10-CM

## 2022-04-06 ENCOUNTER — Other Ambulatory Visit: Payer: BC Managed Care – PPO

## 2022-04-07 ENCOUNTER — Ambulatory Visit
Admission: RE | Admit: 2022-04-07 | Discharge: 2022-04-07 | Disposition: A | Discharge status: Home / Self Care | Payer: BC Managed Care – PPO | Source: Ambulatory Visit | Attending: Internal Medicine | Admitting: Internal Medicine

## 2022-04-07 DIAGNOSIS — I2510 Atherosclerotic heart disease of native coronary artery without angina pectoris: Secondary | ICD-10-CM | POA: Diagnosis not present

## 2022-04-07 DIAGNOSIS — R059 Cough, unspecified: Secondary | ICD-10-CM | POA: Diagnosis not present

## 2022-04-07 DIAGNOSIS — R911 Solitary pulmonary nodule: Secondary | ICD-10-CM

## 2022-04-07 DIAGNOSIS — R062 Wheezing: Secondary | ICD-10-CM | POA: Diagnosis not present

## 2022-04-08 ENCOUNTER — Other Ambulatory Visit: Payer: BC Managed Care – PPO

## 2022-04-17 DIAGNOSIS — D485 Neoplasm of uncertain behavior of skin: Secondary | ICD-10-CM | POA: Diagnosis not present

## 2022-04-17 DIAGNOSIS — L821 Other seborrheic keratosis: Secondary | ICD-10-CM | POA: Diagnosis not present

## 2022-04-17 DIAGNOSIS — D225 Melanocytic nevi of trunk: Secondary | ICD-10-CM | POA: Diagnosis not present

## 2022-06-05 DIAGNOSIS — R399 Unspecified symptoms and signs involving the genitourinary system: Secondary | ICD-10-CM | POA: Diagnosis not present

## 2023-03-09 DIAGNOSIS — I10 Essential (primary) hypertension: Secondary | ICD-10-CM | POA: Diagnosis not present

## 2023-03-09 DIAGNOSIS — N529 Male erectile dysfunction, unspecified: Secondary | ICD-10-CM | POA: Diagnosis not present

## 2023-03-09 DIAGNOSIS — R7989 Other specified abnormal findings of blood chemistry: Secondary | ICD-10-CM | POA: Diagnosis not present

## 2023-03-09 DIAGNOSIS — R82998 Other abnormal findings in urine: Secondary | ICD-10-CM | POA: Diagnosis not present

## 2023-03-09 DIAGNOSIS — Z125 Encounter for screening for malignant neoplasm of prostate: Secondary | ICD-10-CM | POA: Diagnosis not present

## 2023-03-09 DIAGNOSIS — E785 Hyperlipidemia, unspecified: Secondary | ICD-10-CM | POA: Diagnosis not present

## 2023-04-19 DIAGNOSIS — M205X2 Other deformities of toe(s) (acquired), left foot: Secondary | ICD-10-CM | POA: Diagnosis not present

## 2023-04-19 DIAGNOSIS — M792 Neuralgia and neuritis, unspecified: Secondary | ICD-10-CM | POA: Diagnosis not present

## 2023-04-28 DIAGNOSIS — M722 Plantar fascial fibromatosis: Secondary | ICD-10-CM | POA: Diagnosis not present

## 2023-04-29 ENCOUNTER — Ambulatory Visit: Payer: BC Managed Care – PPO | Admitting: Podiatry

## 2023-05-28 DIAGNOSIS — M792 Neuralgia and neuritis, unspecified: Secondary | ICD-10-CM | POA: Diagnosis not present

## 2023-08-10 DIAGNOSIS — M7711 Lateral epicondylitis, right elbow: Secondary | ICD-10-CM | POA: Diagnosis not present

## 2023-08-18 DIAGNOSIS — M7711 Lateral epicondylitis, right elbow: Secondary | ICD-10-CM | POA: Diagnosis not present

## 2023-10-22 DIAGNOSIS — J4520 Mild intermittent asthma, uncomplicated: Secondary | ICD-10-CM | POA: Diagnosis not present

## 2023-10-22 DIAGNOSIS — R051 Acute cough: Secondary | ICD-10-CM | POA: Diagnosis not present

## 2024-02-23 DIAGNOSIS — M205X2 Other deformities of toe(s) (acquired), left foot: Secondary | ICD-10-CM | POA: Diagnosis not present

## 2024-02-23 DIAGNOSIS — M205X1 Other deformities of toe(s) (acquired), right foot: Secondary | ICD-10-CM | POA: Diagnosis not present

## 2024-03-22 DIAGNOSIS — M722 Plantar fascial fibromatosis: Secondary | ICD-10-CM | POA: Diagnosis not present

## 2024-03-27 DIAGNOSIS — Z125 Encounter for screening for malignant neoplasm of prostate: Secondary | ICD-10-CM | POA: Diagnosis not present

## 2024-03-27 DIAGNOSIS — E785 Hyperlipidemia, unspecified: Secondary | ICD-10-CM | POA: Diagnosis not present

## 2024-04-03 DIAGNOSIS — Z0000 Encounter for general adult medical examination without abnormal findings: Secondary | ICD-10-CM | POA: Diagnosis not present

## 2024-04-03 DIAGNOSIS — Z1331 Encounter for screening for depression: Secondary | ICD-10-CM | POA: Diagnosis not present

## 2024-04-03 DIAGNOSIS — I10 Essential (primary) hypertension: Secondary | ICD-10-CM | POA: Diagnosis not present

## 2024-06-14 DIAGNOSIS — N4341 Spermatocele of epididymis, single: Secondary | ICD-10-CM | POA: Diagnosis not present

## 2024-06-14 DIAGNOSIS — N50812 Left testicular pain: Secondary | ICD-10-CM | POA: Diagnosis not present

## 2024-07-06 ENCOUNTER — Other Ambulatory Visit: Payer: Self-pay | Admitting: Internal Medicine

## 2024-07-06 DIAGNOSIS — E785 Hyperlipidemia, unspecified: Secondary | ICD-10-CM

## 2024-07-13 ENCOUNTER — Inpatient Hospital Stay: Admission: RE | Admit: 2024-07-13 | Discharge: 2024-07-13 | Attending: Internal Medicine | Admitting: Internal Medicine

## 2024-07-13 DIAGNOSIS — E785 Hyperlipidemia, unspecified: Secondary | ICD-10-CM

## 2024-08-16 DIAGNOSIS — M5416 Radiculopathy, lumbar region: Secondary | ICD-10-CM | POA: Diagnosis not present

## 2024-08-16 DIAGNOSIS — M533 Sacrococcygeal disorders, not elsewhere classified: Secondary | ICD-10-CM | POA: Diagnosis not present

## 2024-08-28 DIAGNOSIS — M5416 Radiculopathy, lumbar region: Secondary | ICD-10-CM | POA: Diagnosis not present

## 2024-09-02 DIAGNOSIS — R509 Fever, unspecified: Secondary | ICD-10-CM | POA: Diagnosis not present

## 2024-09-04 DIAGNOSIS — R61 Generalized hyperhidrosis: Secondary | ICD-10-CM | POA: Diagnosis not present

## 2024-09-04 DIAGNOSIS — I10 Essential (primary) hypertension: Secondary | ICD-10-CM | POA: Diagnosis not present

## 2024-09-04 DIAGNOSIS — R634 Abnormal weight loss: Secondary | ICD-10-CM | POA: Diagnosis not present

## 2024-09-05 ENCOUNTER — Ambulatory Visit
Admission: RE | Admit: 2024-09-05 | Discharge: 2024-09-05 | Disposition: A | Discharge status: Home / Self Care | Source: Ambulatory Visit | Attending: Internal Medicine | Admitting: Internal Medicine

## 2024-09-05 ENCOUNTER — Encounter (HOSPITAL_COMMUNITY): Payer: Self-pay | Admitting: Internal Medicine

## 2024-09-05 ENCOUNTER — Other Ambulatory Visit (HOSPITAL_COMMUNITY): Payer: Self-pay | Admitting: Internal Medicine

## 2024-09-05 DIAGNOSIS — R519 Headache, unspecified: Secondary | ICD-10-CM

## 2024-09-06 DIAGNOSIS — R946 Abnormal results of thyroid function studies: Secondary | ICD-10-CM | POA: Diagnosis not present

## 2024-09-11 ENCOUNTER — Encounter: Payer: Self-pay | Admitting: Internal Medicine

## 2024-09-11 ENCOUNTER — Other Ambulatory Visit: Payer: Self-pay | Admitting: Internal Medicine

## 2024-09-11 DIAGNOSIS — E0590 Thyrotoxicosis, unspecified without thyrotoxic crisis or storm: Secondary | ICD-10-CM

## 2024-09-12 ENCOUNTER — Other Ambulatory Visit: Payer: Self-pay | Admitting: Internal Medicine

## 2024-09-12 DIAGNOSIS — E0590 Thyrotoxicosis, unspecified without thyrotoxic crisis or storm: Secondary | ICD-10-CM

## 2024-09-18 ENCOUNTER — Encounter
Admission: RE | Admit: 2024-09-18 | Discharge: 2024-09-18 | Disposition: A | Discharge status: Home / Self Care | Source: Ambulatory Visit | Attending: Internal Medicine | Admitting: Internal Medicine

## 2024-09-18 ENCOUNTER — Inpatient Hospital Stay: Admission: RE | Admit: 2024-09-18 | Discharge: 2024-09-18 | Attending: Internal Medicine | Admitting: Internal Medicine

## 2024-09-18 DIAGNOSIS — E0590 Thyrotoxicosis, unspecified without thyrotoxic crisis or storm: Secondary | ICD-10-CM | POA: Insufficient documentation

## 2024-09-18 MED ORDER — SODIUM IODIDE I-123 7.4 MBQ CAPS
412.5400 | ORAL_CAPSULE | Freq: Once | ORAL | Status: AC
  Administered 2024-09-18: 412.54 via ORAL

## 2024-09-19 ENCOUNTER — Inpatient Hospital Stay: Admission: RE | Admit: 2024-09-19 | Discharge: 2024-09-19 | Attending: Internal Medicine | Admitting: Internal Medicine

## 2024-09-19 DIAGNOSIS — Z0189 Encounter for other specified special examinations: Secondary | ICD-10-CM | POA: Diagnosis not present

## 2024-09-19 DIAGNOSIS — G4719 Other hypersomnia: Secondary | ICD-10-CM | POA: Diagnosis not present

## 2024-09-20 DIAGNOSIS — G4719 Other hypersomnia: Secondary | ICD-10-CM | POA: Diagnosis not present
